# Patient Record
Sex: Male | Born: 1995 | Race: Black or African American | Hispanic: No | Marital: Single | Smoking: Never smoker
Health system: Southern US, Community
[De-identification: ages and names within clinical notes are randomized; demographics above are authoritative.]

## PROBLEM LIST (undated history)

## (undated) DIAGNOSIS — I82409 Acute embolism and thrombosis of unspecified deep veins of unspecified lower extremity: Secondary | ICD-10-CM

## (undated) HISTORY — PX: FINGER FRACTURE SURGERY: SHX638

---
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
## 2015-06-12 IMAGING — CT CT ANGIO CHEST
1 of 2 series · 19 of 32 positions shown · IV contrast (OMNIPAQUE 350)
Comparison: None.

CLINICAL DATA: 17-year-old male with chest pain. Recent diagnosis
of right lower extremity DVT.

EXAM:
CT ANGIOGRAPHY CHEST WITH CONTRAST
TECHNIQUE: Multidetector CT imaging of the chest was performed using the
standard protocol during bolus administration of intravenous
contrast. Multiplanar CT image reconstructions and MIPs were
obtained to evaluate the vascular anatomy.
CONTRAST:  100mL OMNIPAQUE IOHEXOL 350 MG/ML SOLN

[Series 6: thins for pacs · axial · 0.70mm/px · z∈[-326,-54]mm · 19 of 304 slices shown]
[im 16/304  lung]
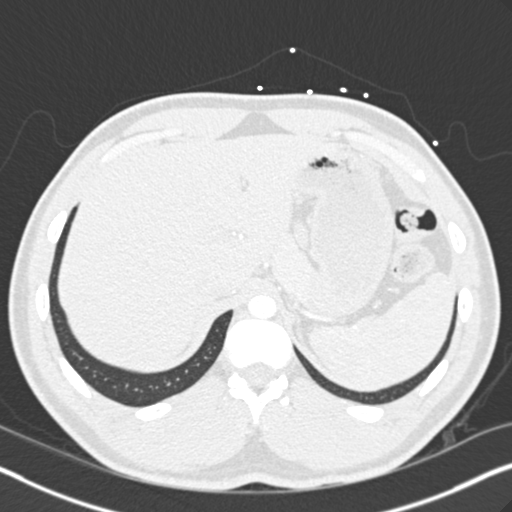
[im 31/304  mediastinal]
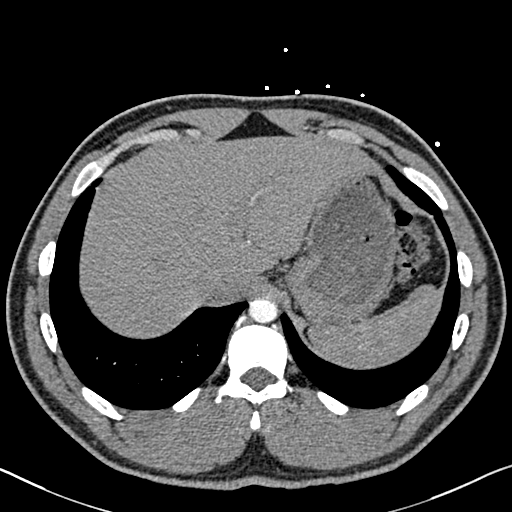
[im 46/304  lung]
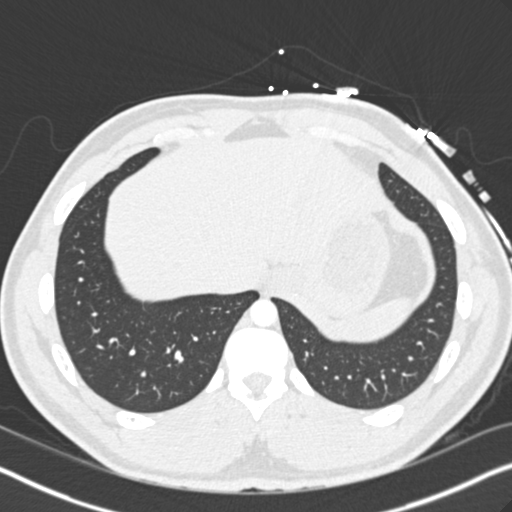
[im 76/304  mediastinal]
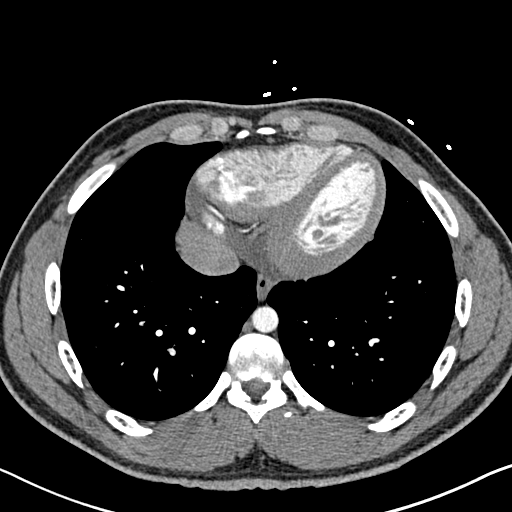
[im 91/304  lung]
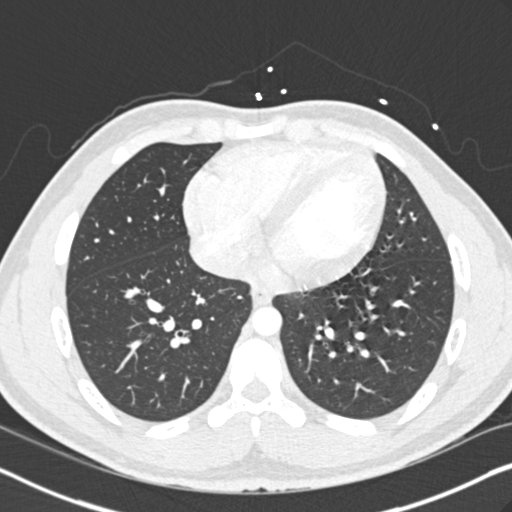
[im 102/304  mediastinal]
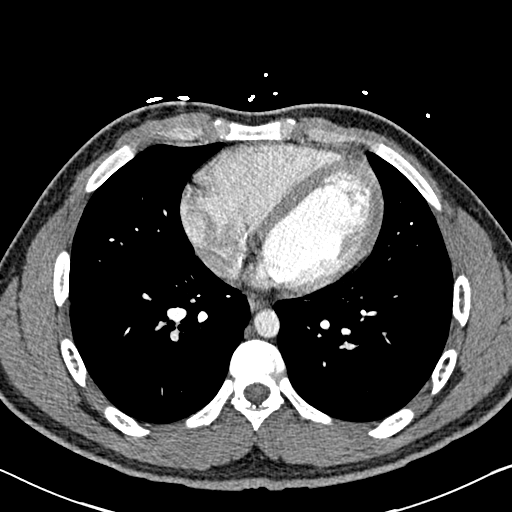
[im 107/304  lung]
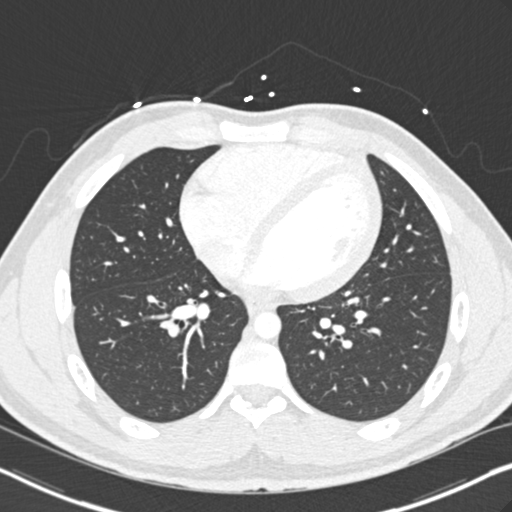
[im 122/304  mediastinal]
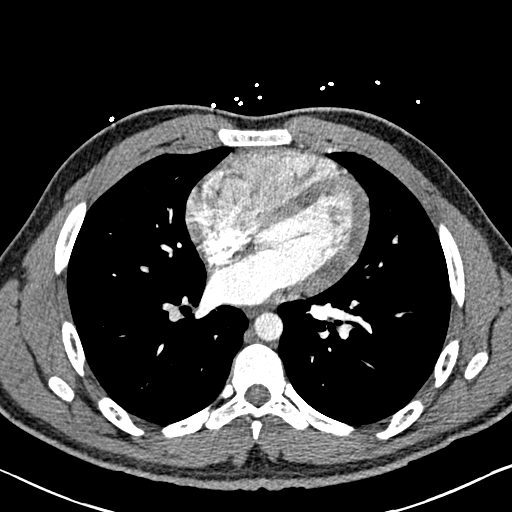
[im 137/304  lung]
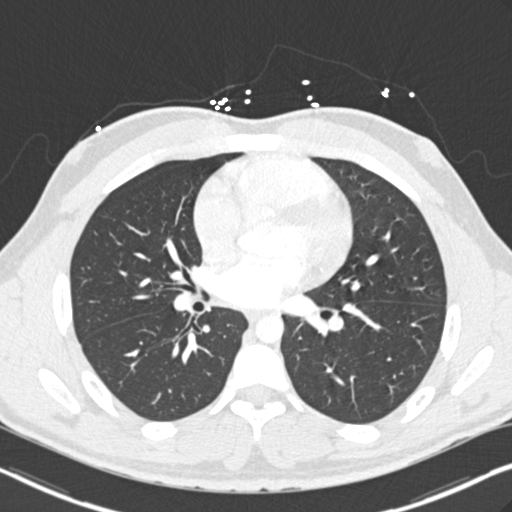
[im 152/304  mediastinal]
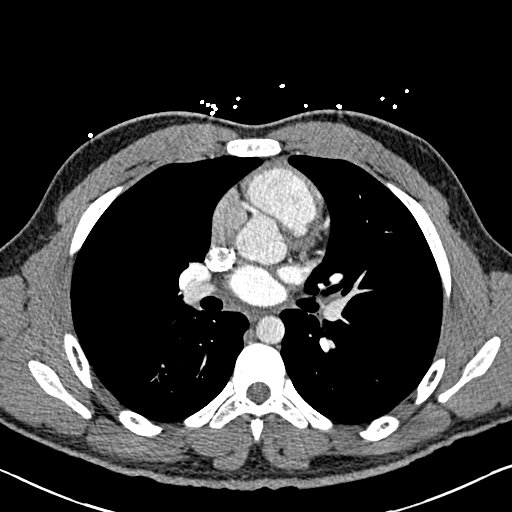
[im 167/304  lung]
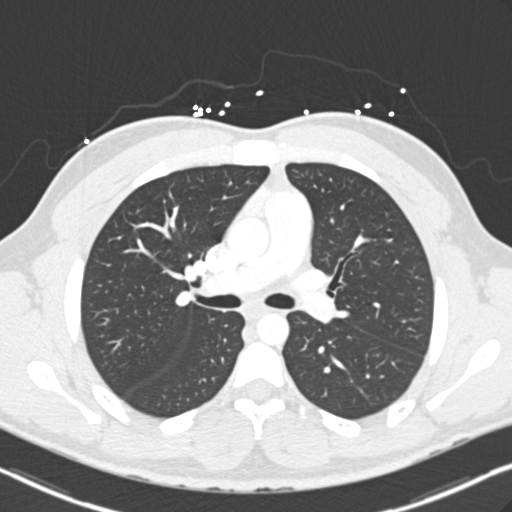
[im 182/304  mediastinal]
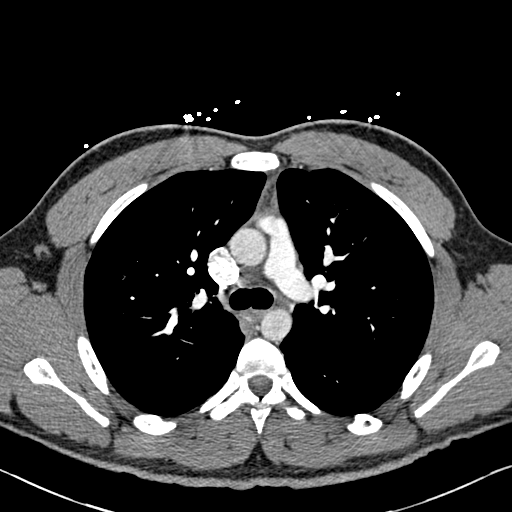
[im 197/304  lung]
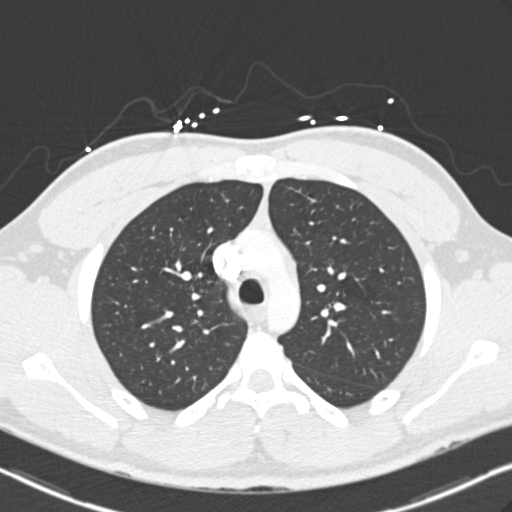
[im 203/304  mediastinal]
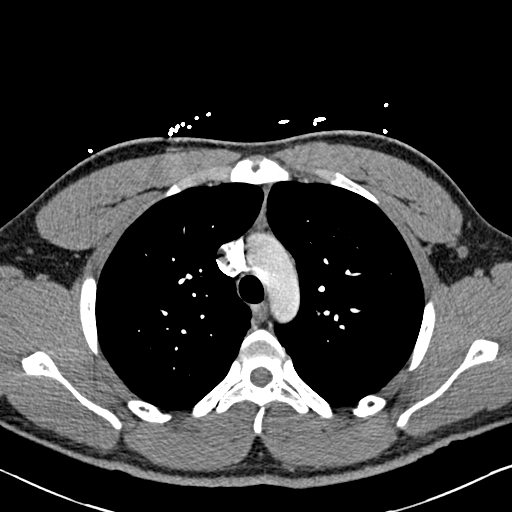
[im 213/304  lung]
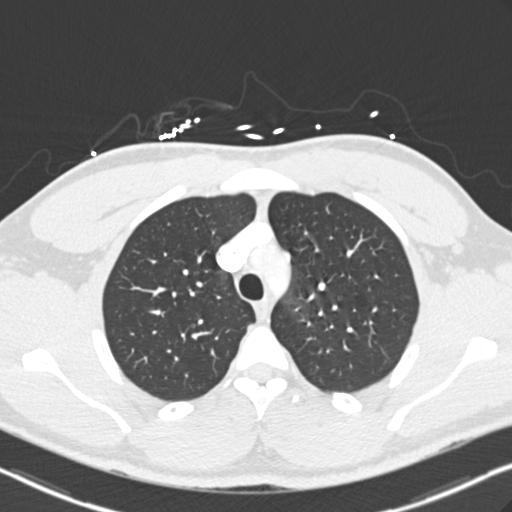
[im 228/304  mediastinal]
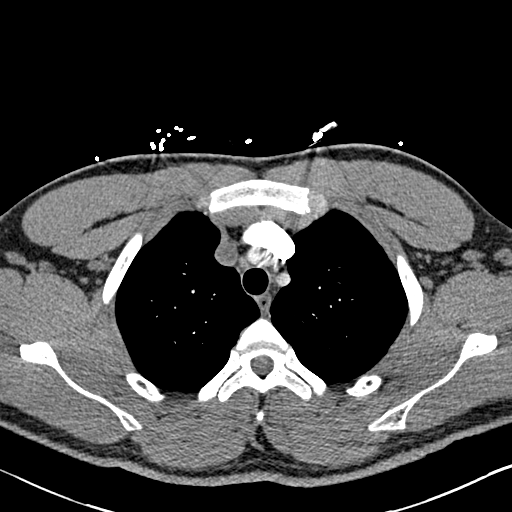
[im 258/304  lung]
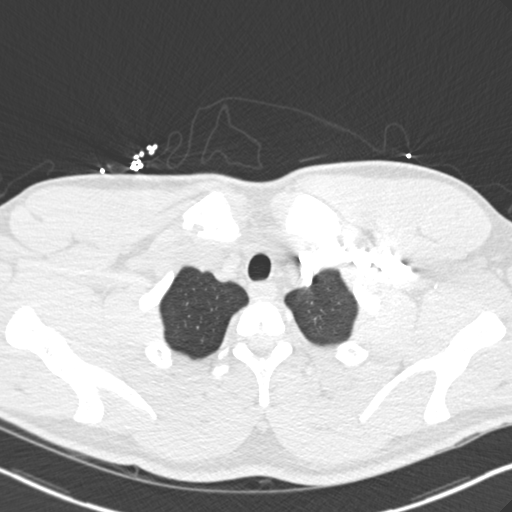
[im 273/304  mediastinal]
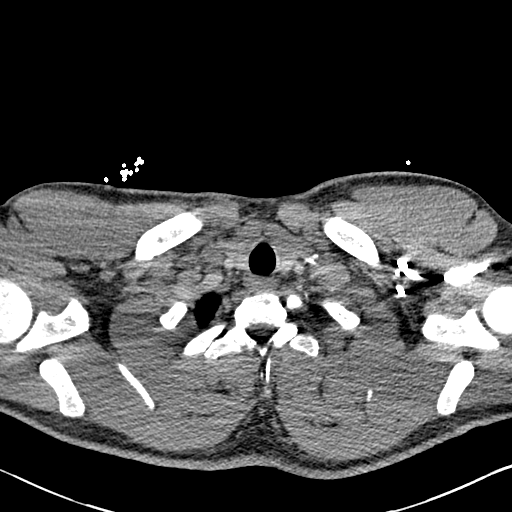
[im 288/304  lung]
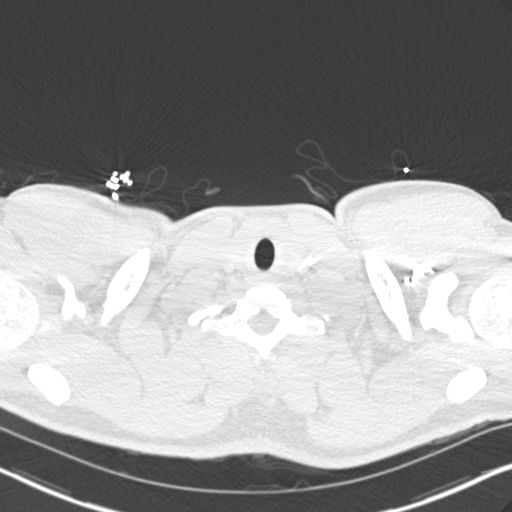

[19 of 32 positions shown; findings below may reference images not displayed]

FINDINGS: This is a technically adequate study but respiratory motion artifact
decreases sensitivity in several areas.

There is no evidence of pulmonary emboli or thoracic aortic
aneurysm.

The heart and great vessels are unremarkable.

No pleural or pericardial effusions are identified.

No enlarged or abnormal appearing lymph nodes are noted.

The lungs are clear. There is no evidence of airspace disease,
consolidation nodule, mass or endobronchial/endotracheal lesion.

The visualized upper abdomen is unremarkable.

No acute or suspicious bony abnormalities are identified.

Review of the MIP images confirms the above findings.
IMPRESSION: Unremarkable exam - no evidence of pulmonary emboli.
